# Patient Record
Sex: Female | Born: 2005 | Hispanic: Yes | Marital: Single | Smoking: Never smoker
Health system: Southern US, Community
[De-identification: ages and names within clinical notes are randomized; demographics above are authoritative.]

---
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
## 2023-04-05 ENCOUNTER — Other Ambulatory Visit: Payer: Self-pay

## 2023-04-05 ENCOUNTER — Encounter: Payer: Self-pay | Admitting: Emergency Medicine

## 2023-04-05 ENCOUNTER — Emergency Department
Admission: EM | Admit: 2023-04-05 | Discharge: 2023-04-05 | Disposition: A | Discharge status: Home / Self Care | Payer: MEDICAID | Attending: Emergency Medicine | Admitting: Emergency Medicine

## 2023-04-05 DIAGNOSIS — K2900 Acute gastritis without bleeding: Secondary | ICD-10-CM | POA: Diagnosis not present

## 2023-04-05 DIAGNOSIS — R112 Nausea with vomiting, unspecified: Secondary | ICD-10-CM | POA: Diagnosis present

## 2023-04-05 LAB — URINALYSIS, ROUTINE W REFLEX MICROSCOPIC
Bilirubin Urine: NEGATIVE
Glucose, UA: NEGATIVE mg/dL
Hgb urine dipstick: NEGATIVE
Ketones, ur: 20 mg/dL — AB
Leukocytes,Ua: NEGATIVE
Nitrite: NEGATIVE
Protein, ur: 30 mg/dL — AB
Specific Gravity, Urine: 1.026 (ref 1.005–1.030)
pH: 6 (ref 5.0–8.0)

## 2023-04-05 LAB — COMPREHENSIVE METABOLIC PANEL
ALT: 22 U/L (ref 0–44)
AST: 20 U/L (ref 15–41)
Albumin: 4.6 g/dL (ref 3.5–5.0)
Alkaline Phosphatase: 63 U/L (ref 38–126)
Anion gap: 14 (ref 5–15)
BUN: 25 mg/dL — ABNORMAL HIGH (ref 6–20)
CO2: 24 mmol/L (ref 22–32)
Calcium: 9.4 mg/dL (ref 8.9–10.3)
Chloride: 96 mmol/L — ABNORMAL LOW (ref 98–111)
Creatinine, Ser: 0.75 mg/dL (ref 0.44–1.00)
GFR, Estimated: >60 mL/min (ref >60)
Glucose, Bld: 116 mg/dL — ABNORMAL HIGH (ref 70–99)
Potassium: 3.2 mmol/L — ABNORMAL LOW (ref 3.5–5.1)
Sodium: 134 mmol/L — ABNORMAL LOW (ref 135–145)
Total Bilirubin: 1.2 mg/dL — ABNORMAL HIGH (ref <1.2)
Total Protein: 8 g/dL (ref 6.5–8.1)

## 2023-04-05 LAB — CBC
HCT: 41.2 % (ref 36.0–46.0)
Hemoglobin: 14.2 g/dL (ref 12.0–15.0)
MCH: 28.4 pg (ref 26.0–34.0)
MCHC: 34.5 g/dL (ref 30.0–36.0)
MCV: 82.4 fL (ref 80.0–100.0)
Platelets: 364 10*3/uL (ref 150–400)
RBC: 5 MIL/uL (ref 3.87–5.11)
RDW: 13.6 % (ref 11.5–15.5)
WBC: 10.6 10*3/uL — ABNORMAL HIGH (ref 4.0–10.5)
nRBC: 0 % (ref 0.0–0.2)

## 2023-04-05 LAB — LIPASE, BLOOD: Lipase: 23 U/L (ref 11–51)

## 2023-04-05 LAB — POC URINE PREG, ED: Preg Test, Ur: NEGATIVE

## 2023-04-05 MED ORDER — ONDANSETRON 4 MG PO TBDP: 4.0000 mg | ORAL_TABLET | Freq: Three times a day (TID) | ORAL | 0 refills | Status: DC | PRN

## 2023-04-05 MED ORDER — ONDANSETRON 4 MG PO TBDP
4.0000 mg | ORAL_TABLET | Freq: Once | ORAL | Status: AC
  Administered 2023-04-05: 4 mg via ORAL
  Filled 2023-04-05: qty 1

## 2023-04-05 NOTE — ED Provider Notes (Signed)
[facility] Provider Note    Event Date/Time   First MD Initiated Contact with Patient 04/05/23 [phone]     (approximate)   History   Emesis   HPI  Yanette Wiesner is a 17 y.o. female with no significant past medical history presents with complaints of nausea and vomiting which is been occurring for 2 days.  She denies fevers, no diarrhea reported.  Denies abdominal pain.  Reports normal periods.  Has not take anything for this     Physical Exam   Triage Vital Signs: ED Triage Vitals [04/05/23 0827]  Encounter Vitals Group     BP 122/81     Systolic BP Percentile      Diastolic BP Percentile      Pulse Rate 74     Resp 17     Temp 98.1 F (36.7 C)     Temp Source Oral     SpO2 97 %     Weight 77.1 kg (169 lb 15.6 oz)     Height 1.6 m (5\' 3" )     Head Circumference      Peak Flow      Pain Score 0     Pain Loc      Pain Education      Exclude from Growth Chart     Most recent vital signs: Vitals:   04/05/23 0827  BP: 122/81  Pulse: 74  Resp: 17  Temp: 98.1 F (36.7 C)  SpO2: 97%     General: Awake, no distress.  CV:  Good peripheral perfusion.  Resp:  Normal effort.  Abd:  No distention.  Soft, nontender reassuring exam Other:     ED Results / Procedures / Treatments   Labs (all labs ordered are listed, but only abnormal results are displayed) Labs Reviewed  COMPREHENSIVE METABOLIC PANEL - Abnormal; Notable for the following components:      Result Value   Sodium 134 (*)    Potassium 3.2 (*)    Chloride 96 (*)    Glucose, Bld 116 (*)    BUN 25 (*)    Total Bilirubin 1.2 (*)    All other components within normal limits  CBC - Abnormal; Notable for the following components:   WBC 10.6 (*)    All other components within normal limits  URINALYSIS, ROUTINE W REFLEX MICROSCOPIC - Abnormal; Notable for the following components:   Color, Urine YELLOW (*)    APPearance CLOUDY (*)    Ketones, ur 20 (*)     Protein, ur 30 (*)    Bacteria, UA RARE (*)    All other components within normal limits  LIPASE, BLOOD  POC URINE PREG, ED     EKG     RADIOLOGY     PROCEDURES:  Critical Care performed:   Procedures   MEDICATIONS ORDERED IN ED: Medications  ondansetron (ZOFRAN-ODT) disintegrating tablet 4 mg (4 mg Oral Given 04/05/23 0900)     IMPRESSION / MDM / ASSESSMENT AND PLAN / ED COURSE  I reviewed the triage vital signs and the nursing notes. Patient's presentation is most consistent with acute illness / injury with system symptoms.  Patient presents with nausea and vomiting as detailed above, differential includes foodborne illness, viral gastritis/gastroenteritis.  Overall quite well-appearing reassuring abdominal exam, lab work is unremarkable and unchanged from prior labs.  Pregnancy test is negative.  Will treat with ODT Zofran, supportive care, outpatient follow-up, suspect viral gastritis which is prevalent in  the community at this time.  Turn precautions discussed, patient agrees with this plan, no indication for admission at this time.        FINAL CLINICAL IMPRESSION(S) / ED DIAGNOSES   Final diagnoses:  Acute gastritis without hemorrhage, unspecified gastritis type     Rx / DC Orders   ED Discharge Orders          Ordered    ondansetron (ZOFRAN-ODT) 4 MG disintegrating tablet  Every 8 hours PRN        04/05/23 0911             Note:  This document was prepared using Dragon voice recognition software and may include unintentional dictation errors.   Jene Every, MD 04/05/23 [phone]

## 2023-04-05 NOTE — ED Triage Notes (Signed)
Pt here with vomiting since Sat. Pt denies abd pain. Pt denies fever. Pt ambulatory to triage.

## 2023-12-29 ENCOUNTER — Other Ambulatory Visit: Payer: Self-pay

## 2023-12-29 ENCOUNTER — Emergency Department
Admission: EM | Admit: 2023-12-29 | Discharge: 2023-12-29 | Disposition: A | Discharge status: Home / Self Care | Payer: MEDICAID | Attending: Emergency Medicine | Admitting: Emergency Medicine

## 2023-12-29 ENCOUNTER — Emergency Department: Payer: MEDICAID

## 2023-12-29 DIAGNOSIS — R112 Nausea with vomiting, unspecified: Secondary | ICD-10-CM | POA: Insufficient documentation

## 2023-12-29 DIAGNOSIS — D72829 Elevated white blood cell count, unspecified: Secondary | ICD-10-CM | POA: Diagnosis not present

## 2023-12-29 DIAGNOSIS — R1084 Generalized abdominal pain: Secondary | ICD-10-CM | POA: Diagnosis not present

## 2023-12-29 LAB — POC URINE PREG, ED: Preg Test, Ur: NEGATIVE

## 2023-12-29 LAB — CBC
HCT: 44 % (ref 36.0–46.0)
Hemoglobin: 15.2 g/dL — ABNORMAL HIGH (ref 12.0–15.0)
MCH: 28.1 pg (ref 26.0–34.0)
MCHC: 34.5 g/dL (ref 30.0–36.0)
MCV: 81.3 fL (ref 80.0–100.0)
Platelets: 506 K/uL — ABNORMAL HIGH (ref 150–400)
RBC: 5.41 MIL/uL — ABNORMAL HIGH (ref 3.87–5.11)
RDW: 13.8 % (ref 11.5–15.5)
WBC: 18.8 K/uL — ABNORMAL HIGH (ref 4.0–10.5)
nRBC: 0 % (ref 0.0–0.2)

## 2023-12-29 LAB — URINALYSIS, ROUTINE W REFLEX MICROSCOPIC
Bilirubin Urine: NEGATIVE
Glucose, UA: NEGATIVE mg/dL
Hgb urine dipstick: NEGATIVE
Ketones, ur: 20 mg/dL — AB
Leukocytes,Ua: NEGATIVE
Nitrite: NEGATIVE
Protein, ur: NEGATIVE mg/dL
Specific Gravity, Urine: >1.046 — ABNORMAL HIGH (ref 1.005–1.030)
pH: 6 (ref 5.0–8.0)

## 2023-12-29 LAB — COMPREHENSIVE METABOLIC PANEL WITH GFR
ALT: 16 U/L (ref 0–44)
AST: 17 U/L (ref 15–41)
Albumin: 4.8 g/dL (ref 3.5–5.0)
Alkaline Phosphatase: 66 U/L (ref 38–126)
Anion gap: 20 — ABNORMAL HIGH (ref 5–15)
BUN: 23 mg/dL — ABNORMAL HIGH (ref 6–20)
CO2: 23 mmol/L (ref 22–32)
Calcium: 9.9 mg/dL (ref 8.9–10.3)
Chloride: 94 mmol/L — ABNORMAL LOW (ref 98–111)
Creatinine, Ser: 0.7 mg/dL (ref 0.44–1.00)
GFR, Estimated: >60 mL/min (ref >60)
Glucose, Bld: 109 mg/dL — ABNORMAL HIGH (ref 70–99)
Potassium: 3.3 mmol/L — ABNORMAL LOW (ref 3.5–5.1)
Sodium: 137 mmol/L (ref 135–145)
Total Bilirubin: 1.9 mg/dL — ABNORMAL HIGH (ref 0.0–1.2)
Total Protein: 8.2 g/dL — ABNORMAL HIGH (ref 6.5–8.1)

## 2023-12-29 LAB — LIPASE, BLOOD: Lipase: 30 U/L (ref 11–51)

## 2023-12-29 MED ORDER — ONDANSETRON HCL 4 MG/2ML IJ SOLN
4.0000 mg | Freq: Once | INTRAMUSCULAR | Status: AC
  Administered 2023-12-29: 4 mg via INTRAVENOUS
  Filled 2023-12-29: qty 2

## 2023-12-29 MED ORDER — LACTATED RINGERS IV BOLUS
1000.0000 mL | Freq: Once | INTRAVENOUS | Status: AC
  Administered 2023-12-29: 1000 mL via INTRAVENOUS

## 2023-12-29 MED ORDER — PROCHLORPERAZINE EDISYLATE 10 MG/2ML IJ SOLN
10.0000 mg | Freq: Once | INTRAMUSCULAR | Status: AC
  Administered 2023-12-29: 10 mg via INTRAVENOUS
  Filled 2023-12-29: qty 2

## 2023-12-29 MED ORDER — PROCHLORPERAZINE EDISYLATE 10 MG/2ML IJ SOLN: 5.0000 mg | Freq: Once | INTRAMUSCULAR | Status: DC

## 2023-12-29 MED ORDER — POTASSIUM CHLORIDE CRYS ER 20 MEQ PO TBCR
40.0000 meq | EXTENDED_RELEASE_TABLET | Freq: Once | ORAL | Status: AC
  Administered 2023-12-29: 40 meq via ORAL
  Filled 2023-12-29: qty 2

## 2023-12-29 MED ORDER — IOHEXOL 300 MG/ML  SOLN
100.0000 mL | Freq: Once | INTRAMUSCULAR | Status: AC | PRN
  Administered 2023-12-29: 100 mL via INTRAVENOUS

## 2023-12-29 MED ORDER — ONDANSETRON 4 MG PO TBDP: 4.0000 mg | ORAL_TABLET | Freq: Three times a day (TID) | ORAL | 0 refills | Status: AC | PRN

## 2023-12-29 NOTE — ED Triage Notes (Signed)
 Patient states vomiting since Saturday; reports only has abdominal pain while vomiting. Denies diarrhea and urinary symptoms, no known sick contacts.

## 2023-12-29 NOTE — ED Provider Notes (Signed)
 [facility] Provider Note    Event Date/Time   First MD Initiated Contact with Patient 12/29/23 1603     (approximate)   History   Chief Complaint Emesis   HPI  Kelly Gonzales is a 18 y.o. female with no significant past medical history who presents to the ED complaining of nausea and vomiting.  Patient reports that she has been dealing with persistent nausea and numerous episodes of vomiting over the past 4 days.  She has had difficulty keeping anything down during this time but denies any diarrhea.  She has not had any fevers, flank pain, or dysuria.  She does endorse diffuse abdominal pain, primarily when she goes to vomit.  She has never had surgery on her abdomen.     Physical Exam   Triage Vital Signs: ED Triage Vitals [12/29/23 1558]  Encounter Vitals Group     BP 109/68     Girls Systolic BP Percentile      Girls Diastolic BP Percentile      Boys Systolic BP Percentile      Boys Diastolic BP Percentile      Pulse Rate (!) 112     Resp 20     Temp 98.2 F (36.8 C)     Temp Source Oral     SpO2 99 %     Weight 180 lb (81.6 kg)     Height 5' 3 (1.6 m)     Head Circumference      Peak Flow      Pain Score 0     Pain Loc      Pain Education      Exclude from Growth Chart     Most recent vital signs: Vitals:   12/29/23 1558  BP: 109/68  Pulse: (!) 112  Resp: 20  Temp: 98.2 F (36.8 C)  SpO2: 99%    Constitutional: Alert and oriented. Eyes: Conjunctivae are normal. Head: Atraumatic. Nose: No congestion/rhinnorhea. Mouth/Throat: Mucous membranes are dry. Cardiovascular: Normal rate, regular rhythm. Grossly normal heart sounds.  2+ radial pulses bilaterally. Respiratory: Normal respiratory effort.  No retractions. Lungs CTAB. Gastrointestinal: Soft and tender to palpation diffusely with no rebound or guarding. No distention. Musculoskeletal: No lower extremity tenderness nor edema.  Neurologic:  Normal speech and  language. No gross focal neurologic deficits are appreciated.    ED Results / Procedures / Treatments   Labs (all labs ordered are listed, but only abnormal results are displayed) Labs Reviewed  COMPREHENSIVE METABOLIC PANEL WITH GFR - Abnormal; Notable for the following components:      Result Value   Potassium 3.3 (*)    Chloride 94 (*)    Glucose, Bld 109 (*)    BUN 23 (*)    Total Protein 8.2 (*)    Total Bilirubin 1.9 (*)    Anion gap 20 (*)    All other components within normal limits  CBC - Abnormal; Notable for the following components:   WBC 18.8 (*)    RBC 5.41 (*)    Hemoglobin 15.2 (*)    Platelets 506 (*)    All other components within normal limits  URINALYSIS, ROUTINE W REFLEX MICROSCOPIC - Abnormal; Notable for the following components:   Color, Urine YELLOW (*)    APPearance CLEAR (*)    Specific Gravity, Urine >1.046 (*)    Ketones, ur 20 (*)    All other components within normal limits  LIPASE, BLOOD  POC URINE PREG,  ED    RADIOLOGY CT abdomen/pelvis reviewed and interpreted by me with no inflammatory changes, focal fluid collections, or dilated bowel loops.  PROCEDURES:  Critical Care performed: No  Procedures   MEDICATIONS ORDERED IN ED: Medications  ondansetron  (ZOFRAN ) injection 4 mg (4 mg Intravenous Given 12/29/23 1643)  lactated ringers  bolus 1,000 mL (0 mLs Intravenous Stopped 12/29/23 1753)  iohexol  (OMNIPAQUE ) 300 MG/ML solution 100 mL (100 mLs Intravenous Contrast Given 12/29/23 1704)  potassium chloride  SA (KLOR-CON  M) CR tablet 40 mEq (40 mEq Oral Given 12/29/23 1756)  lactated ringers  bolus 1,000 mL (1,000 mLs Intravenous New Bag/Given 12/29/23 1810)  prochlorperazine  (COMPAZINE ) injection 10 mg (10 mg Intravenous Given 12/29/23 1818)     IMPRESSION / MDM / ASSESSMENT AND PLAN / ED COURSE  I reviewed the triage vital signs and the nursing notes.                              18 y.o. female with no significant past medical history  who presents to the ED complaining of persistent nausea and vomiting over the past 4 days.  Patient's presentation is most consistent with acute presentation with potential threat to life or bodily function.  Differential diagnosis includes, but is not limited to, gastroenteritis, dehydration, electrolyte abnormality, AKI, UTI, kidney stone, appendicitis, diverticulitis, cholecystitis, biliary colic.  Patient nontoxic-appearing and in no acute distress, vital signs are remarkable for tachycardia but otherwise reassuring.  Her abdomen is soft but she does have diffuse tenderness to palpation on exam, will further assess with CT imaging.  Labs thus far show leukocytosis without significant anemia, will hydrate with IV fluids and treat symptomatically with IV Zofran .  CT imaging with under distention of the stomach but otherwise unremarkable.  Labs with signs of dehydration including elevated BUN to creatinine ratio and increased anion gap along with bilirubin.  No significant AKI or electrolyte abnormality, additional LFTs and lipase are unremarkable.  She did not have much improvement with Zofran , but now feeling much better following IV Compazine .  She is tolerating oral intake without difficulty and is appropriate for discharge home, was counseled to return to the ED for new or worsening symptoms.  Patient agrees with plan.      FINAL CLINICAL IMPRESSION(S) / ED DIAGNOSES   Final diagnoses:  Nausea and vomiting, unspecified vomiting type  Generalized abdominal pain     Rx / DC Orders   ED Discharge Orders          Ordered    ondansetron  (ZOFRAN -ODT) 4 MG disintegrating tablet  Every 8 hours PRN        12/29/23 1852             Note:  This document was prepared using Dragon voice recognition software and may include unintentional dictation errors.   Willo Dunnings, MD 12/29/23 [phone]

## 2024-02-26 ENCOUNTER — Other Ambulatory Visit: Payer: Self-pay

## 2024-02-26 DIAGNOSIS — Z5321 Procedure and treatment not carried out due to patient leaving prior to being seen by health care provider: Secondary | ICD-10-CM | POA: Diagnosis not present

## 2024-02-26 DIAGNOSIS — E860 Dehydration: Secondary | ICD-10-CM | POA: Diagnosis not present

## 2024-02-26 DIAGNOSIS — R112 Nausea with vomiting, unspecified: Secondary | ICD-10-CM | POA: Diagnosis present

## 2024-02-26 DIAGNOSIS — R1084 Generalized abdominal pain: Secondary | ICD-10-CM | POA: Diagnosis not present

## 2024-02-26 DIAGNOSIS — R109 Unspecified abdominal pain: Secondary | ICD-10-CM | POA: Diagnosis not present

## 2024-02-26 DIAGNOSIS — R1110 Vomiting, unspecified: Secondary | ICD-10-CM | POA: Diagnosis present

## 2024-02-26 LAB — CBC
HCT: 41.1 % (ref 36.0–46.0)
Hemoglobin: 14 g/dL (ref 12.0–15.0)
MCH: 28.3 pg (ref 26.0–34.0)
MCHC: 34.1 g/dL (ref 30.0–36.0)
MCV: 83 fL (ref 80.0–100.0)
Platelets: 448 K/uL — ABNORMAL HIGH (ref 150–400)
RBC: 4.95 MIL/uL (ref 3.87–5.11)
RDW: 14.1 % (ref 11.5–15.5)
WBC: 13.3 K/uL — ABNORMAL HIGH (ref 4.0–10.5)
nRBC: 0 % (ref 0.0–0.2)

## 2024-02-26 LAB — URINALYSIS, ROUTINE W REFLEX MICROSCOPIC
Bacteria, UA: NONE SEEN
Bilirubin Urine: NEGATIVE
Glucose, UA: NEGATIVE mg/dL
Ketones, ur: 20 mg/dL — AB
Leukocytes,Ua: NEGATIVE
Nitrite: NEGATIVE
Protein, ur: 30 mg/dL — AB
Specific Gravity, Urine: 1.024 (ref 1.005–1.030)
pH: 6 (ref 5.0–8.0)

## 2024-02-26 LAB — POC URINE PREG, ED: Preg Test, Ur: NEGATIVE

## 2024-02-26 NOTE — ED Triage Notes (Addendum)
 Patient arrived POV from home with complaint of nausea and vomiting x 3 days.    Denies fever, denies abd pain.   Patient and friend reports symptoms started after smoking marijuana.

## 2024-02-27 ENCOUNTER — Emergency Department
Admission: EM | Admit: 2024-02-27 | Discharge: 2024-02-27 | Discharge status: Against Medical Advice | Payer: MEDICAID | Attending: Emergency Medicine | Admitting: Emergency Medicine

## 2024-02-27 ENCOUNTER — Emergency Department
Admission: EM | Admit: 2024-02-27 | Discharge: 2024-02-27 | Disposition: A | Discharge status: Home / Self Care | Payer: MEDICAID | Source: Home / Self Care | Attending: Emergency Medicine | Admitting: Emergency Medicine

## 2024-02-27 ENCOUNTER — Other Ambulatory Visit: Payer: Self-pay

## 2024-02-27 DIAGNOSIS — R112 Nausea with vomiting, unspecified: Secondary | ICD-10-CM | POA: Insufficient documentation

## 2024-02-27 DIAGNOSIS — R109 Unspecified abdominal pain: Secondary | ICD-10-CM | POA: Insufficient documentation

## 2024-02-27 DIAGNOSIS — R1110 Vomiting, unspecified: Secondary | ICD-10-CM | POA: Insufficient documentation

## 2024-02-27 DIAGNOSIS — Z5321 Procedure and treatment not carried out due to patient leaving prior to being seen by health care provider: Secondary | ICD-10-CM | POA: Insufficient documentation

## 2024-02-27 DIAGNOSIS — E860 Dehydration: Secondary | ICD-10-CM | POA: Insufficient documentation

## 2024-02-27 DIAGNOSIS — R1084 Generalized abdominal pain: Secondary | ICD-10-CM | POA: Insufficient documentation

## 2024-02-27 LAB — COMPREHENSIVE METABOLIC PANEL WITH GFR
ALT: 11 U/L (ref 0–44)
ALT: 12 U/L (ref 0–44)
AST: 15 U/L (ref 15–41)
AST: 13 U/L — ABNORMAL LOW (ref 15–41)
Albumin: 5 g/dL (ref 3.5–5.0)
Albumin: 4.6 g/dL (ref 3.5–5.0)
Alkaline Phosphatase: 68 U/L (ref 38–126)
Alkaline Phosphatase: 66 U/L (ref 38–126)
Anion gap: 14 (ref 5–15)
Anion gap: 16 — ABNORMAL HIGH (ref 5–15)
BUN: 19 mg/dL (ref 6–20)
BUN: 22 mg/dL — ABNORMAL HIGH (ref 6–20)
CO2: 25 mmol/L (ref 22–32)
CO2: 24 mmol/L (ref 22–32)
Calcium: 9.8 mg/dL (ref 8.9–10.3)
Calcium: 9.7 mg/dL (ref 8.9–10.3)
Chloride: 101 mmol/L (ref 98–111)
Chloride: 97 mmol/L — ABNORMAL LOW (ref 98–111)
Creatinine, Ser: 0.55 mg/dL (ref 0.44–1.00)
Creatinine, Ser: 0.59 mg/dL (ref 0.44–1.00)
GFR, Estimated: >60 mL/min (ref >60)
GFR, Estimated: >60 mL/min
Glucose, Bld: 130 mg/dL — ABNORMAL HIGH (ref 70–99)
Glucose, Bld: 113 mg/dL — ABNORMAL HIGH (ref 70–99)
Potassium: 3.2 mmol/L — ABNORMAL LOW (ref 3.5–5.1)
Potassium: 3.1 mmol/L — ABNORMAL LOW (ref 3.5–5.1)
Sodium: 140 mmol/L (ref 135–145)
Sodium: 137 mmol/L (ref 135–145)
Total Bilirubin: 1.1 mg/dL (ref 0.0–1.2)
Total Bilirubin: 1.2 mg/dL (ref 0.0–1.2)
Total Protein: 9.3 g/dL — ABNORMAL HIGH (ref 6.5–8.1)
Total Protein: 8.9 g/dL — ABNORMAL HIGH (ref 6.5–8.1)

## 2024-02-27 LAB — RESP PANEL BY RT-PCR (RSV, FLU A&B, COVID)  RVPGX2
Influenza A by PCR: NEGATIVE
Influenza B by PCR: NEGATIVE
Resp Syncytial Virus by PCR: NEGATIVE
SARS Coronavirus 2 by RT PCR: NEGATIVE

## 2024-02-27 LAB — CBC
HCT: 40.1 % (ref 36.0–46.0)
HCT: 40.1 % (ref 36.0–46.0)
Hemoglobin: 13.4 g/dL (ref 12.0–15.0)
Hemoglobin: 13.5 g/dL (ref 12.0–15.0)
MCH: 27.6 pg (ref 26.0–34.0)
MCH: 28 pg (ref 26.0–34.0)
MCHC: 33.4 g/dL (ref 30.0–36.0)
MCHC: 33.7 g/dL (ref 30.0–36.0)
MCV: 82.5 fL (ref 80.0–100.0)
MCV: 83 fL (ref 80.0–100.0)
Platelets: 455 10*3/uL — ABNORMAL HIGH (ref 150–400)
Platelets: 447 K/uL — ABNORMAL HIGH (ref 150–400)
RBC: 4.86 MIL/uL (ref 3.87–5.11)
RBC: 4.83 MIL/uL (ref 3.87–5.11)
RDW: 14.1 % (ref 11.5–15.5)
RDW: 13.9 % (ref 11.5–15.5)
WBC: 12.7 10*3/uL — ABNORMAL HIGH (ref 4.0–10.5)
WBC: 10.5 K/uL (ref 4.0–10.5)
nRBC: 0 % (ref 0.0–0.2)
nRBC: 0 % (ref 0.0–0.2)

## 2024-02-27 LAB — URINALYSIS, ROUTINE W REFLEX MICROSCOPIC
Bacteria, UA: NONE SEEN
Bilirubin Urine: NEGATIVE
Glucose, UA: NEGATIVE mg/dL
Ketones, ur: 20 mg/dL — AB
Leukocytes,Ua: NEGATIVE
Nitrite: NEGATIVE
Protein, ur: 30 mg/dL — AB
RBC / HPF: >50 RBC/hpf (ref 0–5)
Specific Gravity, Urine: 1.031 — ABNORMAL HIGH (ref 1.005–1.030)
pH: 6 (ref 5.0–8.0)

## 2024-02-27 LAB — POC URINE PREG, ED: Preg Test, Ur: NEGATIVE

## 2024-02-27 LAB — LIPASE, BLOOD: Lipase: 22 U/L (ref 11–51)

## 2024-02-27 MED ORDER — METOCLOPRAMIDE HCL 5 MG/ML IJ SOLN
10.0000 mg | Freq: Once | INTRAMUSCULAR | Status: AC
  Administered 2024-02-27: 10 mg via INTRAVENOUS
  Filled 2024-02-27: qty 2

## 2024-02-27 MED ORDER — METOCLOPRAMIDE HCL 10 MG PO TABS: 10.0000 mg | ORAL_TABLET | Freq: Three times a day (TID) | ORAL | 0 refills | Status: AC | PRN

## 2024-02-27 MED ORDER — SODIUM CHLORIDE 0.9 % IV BOLUS: 1000.0000 mL | Freq: Once | INTRAVENOUS | Status: DC

## 2024-02-27 MED ORDER — LACTATED RINGERS IV BOLUS
1000.0000 mL | Freq: Once | INTRAVENOUS | Status: AC
  Administered 2024-02-27: 1000 mL via INTRAVENOUS

## 2024-02-27 NOTE — ED Provider Notes (Signed)
 [facility] Provider Note    Event Date/Time   First MD Initiated Contact with Patient 02/27/24 0703     (approximate)  History   Chief Complaint: Emesis  HPI  Kelly Gonzales is a 18 y.o. female presents to the emergency department for nausea and vomiting.  According to the patient over the last 3 days she has had fairly persistent nausea and vomiting has not been able to keep down much fluids or any food.  Patient states symptoms started after using marijuana products.  Patient has had similar events in the past after using marijuana products.  Patient states abdominal cramping but denies any focal pain.  No fever.  No diarrhea.  No cough congestion or respiratory symptoms.  No urinary symptoms.  Last menstrual period 1 week ago or so.  Physical Exam   Triage Vital Signs: ED Triage Vitals  Encounter Vitals Group     BP 02/27/24 0631 109/76     Girls Systolic BP Percentile --      Girls Diastolic BP Percentile --      Boys Systolic BP Percentile --      Boys Diastolic BP Percentile --      Pulse Rate 02/27/24 0631 96     Resp 02/27/24 0631 18     Temp 02/27/24 0631 98.2 F (36.8 C)     Temp Source 02/27/24 0631 Oral     SpO2 02/27/24 0631 99 %     Weight 02/27/24 0629 179 lb 14.3 oz (81.6 kg)     Height 02/27/24 0629 5' 3 (1.6 m)     Head Circumference --      Peak Flow --      Pain Score 02/27/24 0629 0     Pain Loc --      Pain Education --      Exclude from Growth Chart --     Most recent vital signs: Vitals:   02/27/24 0631 02/27/24 0719  BP: 109/76   Pulse: 96   Resp: 18   Temp: 98.2 F (36.8 C)   SpO2: 99% 99%    General: Awake, no distress.  CV:  Good peripheral perfusion.  Regular rate and rhythm  Resp:  Normal effort.  Equal breath sounds bilaterally.  Abd:  No distention.  Soft, mild diffuse tenderness with no focal tenderness identified no significant tenderness no rebound or guarding.   ED Results /  Procedures / Treatments   MEDICATIONS ORDERED IN ED: Medications  lactated ringers  bolus 1,000 mL (1,000 mLs Intravenous New Bag/Given 02/27/24 0719)  metoCLOPramide (REGLAN) injection 10 mg (10 mg Intravenous Given 02/27/24 0721)     IMPRESSION / MDM / ASSESSMENT AND PLAN / ED COURSE  I reviewed the triage vital signs and the nursing notes.  Patient's presentation is most consistent with acute presentation with potential threat to life or bodily function.  Patient presents emergency department for nausea and vomiting started after using marijuana products per patient.  Symptoms have been ongoing for approximately 2 to 3 days.  Overall patient appears well, no distress speaking in full sentences.  Reassuring vital signs, reassuring physical exam.  We will IV hydrate, treat with IV Reglan and continue to closely monitor.  Chemistry shows dehydration with a mild anion gap elevation of 16.  Will IV hydrate with 2 L.  Patient states she is ready to go and that the nausea is better.  States she does not want any more fluids.  Patient CBC  has not yet resulted, patient states she does not want to wait any longer is ready go home.  Patient only received approximately 800 cc of IV fluids.  We will discharge per her wishes.  She continues to appear well.  We will prescribe Reglan for nausea.  FINAL CLINICAL IMPRESSION(S) / ED DIAGNOSES   Nausea and vomiting   Note:  This document was prepared using Dragon voice recognition software and may include unintentional dictation errors.   Dorothyann Drivers, MD 02/27/24 [phone]

## 2024-02-27 NOTE — ED Triage Notes (Signed)
 Patient arrived POV from home with complaint of nausea and vomiting x 3 days.    Denies fever, denies abd pain.   Patient and friend reports symptoms started after smoking marijuana.

## 2024-02-27 NOTE — ED Triage Notes (Signed)
 Patient ambulatory to triage with complaints of emesis x4 days. Patient states she has had this previously and it was cannabinoid hyperemesis. Still smokes marijuana regularly. Endorses mild abdominal pain mostly when vomiting.

## 2024-02-28 ENCOUNTER — Emergency Department
Admission: EM | Admit: 2024-02-28 | Discharge: 2024-02-28 | Discharge status: Against Medical Advice | Payer: MEDICAID | Attending: Emergency Medicine | Admitting: Emergency Medicine

## 2024-02-28 LAB — COMPREHENSIVE METABOLIC PANEL WITH GFR
ALT: 12 U/L (ref 0–44)
AST: 12 U/L — ABNORMAL LOW (ref 15–41)
Albumin: 4.5 g/dL (ref 3.5–5.0)
Alkaline Phosphatase: 62 U/L (ref 38–126)
Anion gap: 12 (ref 5–15)
BUN: 20 mg/dL (ref 6–20)
CO2: 24 mmol/L (ref 22–32)
Calcium: 9.5 mg/dL (ref 8.9–10.3)
Chloride: 100 mmol/L (ref 98–111)
Creatinine, Ser: 0.59 mg/dL (ref 0.44–1.00)
GFR, Estimated: >60 mL/min (ref >60)
Glucose, Bld: 116 mg/dL — ABNORMAL HIGH (ref 70–99)
Potassium: 3.2 mmol/L — ABNORMAL LOW (ref 3.5–5.1)
Sodium: 136 mmol/L (ref 135–145)
Total Bilirubin: 1.4 mg/dL — ABNORMAL HIGH (ref 0.0–1.2)
Total Protein: 8.7 g/dL — ABNORMAL HIGH (ref 6.5–8.1)

## 2024-02-28 LAB — LIPASE, BLOOD: Lipase: 24 U/L (ref 11–51)

## 2024-02-28 NOTE — ED Notes (Signed)
 Pt not visualized in lobby, or out front of department, no answer when called x3.

## 2024-02-28 NOTE — ED Notes (Signed)
 No answer x2

## 2024-02-28 NOTE — ED Notes (Signed)
 No answer x1

## 2024-03-26 ENCOUNTER — Emergency Department
Admission: EM | Admit: 2024-03-26 | Discharge: 2024-03-26 | Disposition: A | Discharge status: Home / Self Care | Payer: MEDICAID | Attending: Emergency Medicine | Admitting: Emergency Medicine

## 2024-03-26 ENCOUNTER — Other Ambulatory Visit: Payer: Self-pay

## 2024-03-26 DIAGNOSIS — R1013 Epigastric pain: Secondary | ICD-10-CM | POA: Diagnosis not present

## 2024-03-26 DIAGNOSIS — R112 Nausea with vomiting, unspecified: Secondary | ICD-10-CM | POA: Diagnosis present

## 2024-03-26 LAB — COMPREHENSIVE METABOLIC PANEL WITH GFR
ALT: 11 U/L (ref 0–44)
AST: 12 U/L — ABNORMAL LOW (ref 15–41)
Albumin: 5 g/dL (ref 3.5–5.0)
Alkaline Phosphatase: 80 U/L (ref 38–126)
Anion gap: 16 — ABNORMAL HIGH (ref 5–15)
BUN: 21 mg/dL — ABNORMAL HIGH (ref 6–20)
CO2: 22 mmol/L (ref 22–32)
Calcium: 9.6 mg/dL (ref 8.9–10.3)
Chloride: 97 mmol/L — ABNORMAL LOW (ref 98–111)
Creatinine, Ser: 0.56 mg/dL (ref 0.44–1.00)
GFR, Estimated: >60 mL/min (ref >60)
Glucose, Bld: 118 mg/dL — ABNORMAL HIGH (ref 70–99)
Potassium: 3.4 mmol/L — ABNORMAL LOW (ref 3.5–5.1)
Sodium: 134 mmol/L — ABNORMAL LOW (ref 135–145)
Total Bilirubin: 0.8 mg/dL (ref 0.0–1.2)
Total Protein: 8.2 g/dL — ABNORMAL HIGH (ref 6.5–8.1)

## 2024-03-26 LAB — LIPASE, BLOOD: Lipase: 14 U/L (ref 11–51)

## 2024-03-26 LAB — URINALYSIS, ROUTINE W REFLEX MICROSCOPIC
Bacteria, UA: NONE SEEN
Bilirubin Urine: NEGATIVE
Glucose, UA: NEGATIVE mg/dL
Hgb urine dipstick: NEGATIVE
Ketones, ur: 80 mg/dL — AB
Leukocytes,Ua: NEGATIVE
Nitrite: NEGATIVE
Protein, ur: 30 mg/dL — AB
Specific Gravity, Urine: 1.034 — ABNORMAL HIGH (ref 1.005–1.030)
pH: 5 (ref 5.0–8.0)

## 2024-03-26 LAB — CBC
HCT: 41.3 % (ref 36.0–46.0)
Hemoglobin: 14.2 g/dL (ref 12.0–15.0)
MCH: 28 pg (ref 26.0–34.0)
MCHC: 34.4 g/dL (ref 30.0–36.0)
MCV: 81.3 fL (ref 80.0–100.0)
Platelets: 436 K/uL — ABNORMAL HIGH (ref 150–400)
RBC: 5.08 MIL/uL (ref 3.87–5.11)
RDW: 14 % (ref 11.5–15.5)
WBC: 13.7 K/uL — ABNORMAL HIGH (ref 4.0–10.5)
nRBC: 0 % (ref 0.0–0.2)

## 2024-03-26 LAB — POC URINE PREG, ED: Preg Test, Ur: NEGATIVE

## 2024-03-26 MED ORDER — LACTATED RINGERS IV BOLUS
1000.0000 mL | Freq: Once | INTRAVENOUS | Status: AC
  Administered 2024-03-26: 1000 mL via INTRAVENOUS

## 2024-03-26 MED ORDER — METOCLOPRAMIDE HCL 5 MG/ML IJ SOLN
10.0000 mg | Freq: Once | INTRAMUSCULAR | Status: AC
  Administered 2024-03-26: 10 mg via INTRAVENOUS
  Filled 2024-03-26: qty 2

## 2024-03-26 MED ORDER — METOCLOPRAMIDE HCL 10 MG PO TABS: 10.0000 mg | ORAL_TABLET | Freq: Three times a day (TID) | ORAL | 0 refills | Status: AC | PRN

## 2024-03-26 NOTE — ED Triage Notes (Signed)
 Pt to ED for N/V since 3 days. Has not vomited today but is nauseous. States was vomiting a lot last night. Pt unsure of LNMP but says it was sometime last month. Denies pain at this time. Denies cough, fever, body aches and diarrhea.

## 2024-03-26 NOTE — Discharge Instructions (Signed)
 Please take your nausea medication as needed, as written.  Please drink plenty of fluids.  Return to the emergency department if you are unable to tolerate fluids or for any other symptom personally concerning to yourself.

## 2024-03-26 NOTE — ED Provider Notes (Signed)
 [facility] Provider Note    Event Date/Time   First MD Initiated Contact with Patient 03/26/24 1124     (approximate)  History   Chief Complaint: Emesis  HPI  Kelly Gonzales is a 18 y.o. female with no significant past medical history presents to the emergency department for nausea vomiting.  According to the patient for the past 3 days she has been nauseated with frequent episodes of vomiting.  Patient states a history of this and has been seen multiple times in the emergency department for the same in the past possibly cannabinoid induced but it is not clear.  Patient denies any abdominal pain.  Denies any fever.  No known sick contacts.  Physical Exam   Triage Vital Signs: ED Triage Vitals  Encounter Vitals Group     BP 03/26/24 1007 118/76     Girls Systolic BP Percentile --      Girls Diastolic BP Percentile --      Boys Systolic BP Percentile --      Boys Diastolic BP Percentile --      Pulse Rate 03/26/24 1007 91     Resp 03/26/24 1007 20     Temp 03/26/24 1010 97.9 F (36.6 C)     Temp Source 03/26/24 1010 Oral     SpO2 03/26/24 1007 98 %     Weight 03/26/24 1008 180 lb (81.6 kg)     Height 03/26/24 1008 5' 3 (1.6 m)     Head Circumference --      Peak Flow --      Pain Score 03/26/24 1007 0     Pain Loc --      Pain Education --      Exclude from Growth Chart --     Most recent vital signs: Vitals:   03/26/24 1007 03/26/24 1010  BP: 118/76   Pulse: 91   Resp: 20   Temp:  97.9 F (36.6 C)  SpO2: 98%     General: Awake, no distress.  CV:  Good peripheral perfusion.  Regular rate and rhythm  Resp:  Normal effort.  Equal breath sounds bilaterally.  Abd:  No distention.  Soft, minimal epigastric tenderness.  No rebound or guarding.  ED Results / Procedures / Treatments   MEDICATIONS ORDERED IN ED: Medications  lactated ringers  bolus 1,000 mL (1,000 mLs Intravenous New Bag/Given 03/26/24 1156)  metoCLOPramide   (REGLAN ) injection 10 mg (10 mg Intravenous Given 03/26/24 1153)     IMPRESSION / MDM / ASSESSMENT AND PLAN / ED COURSE  I reviewed the triage vital signs and the nursing notes.  Patient's presentation is most consistent with acute presentation with potential threat to life or bodily function.  Patient presents emergency department for nausea and vomiting.  Patient has a history of the same.  States she has not been able to keep down fluids for the last 2 to 3 days.  Here the patient appears well, no significant abdominal tenderness very minimal epigastric discomfort on palpation which she relates to the retching/vomiting.  Patient's lab work shows a CBC with slight leukocytosis of 13,000, chemistry shows mild dehydration with an anion gap of 16 reassuringly normal renal function.  LFTs and lipase are normal.  We will treat with IV fluids nausea medication and reassess.  We will send a urinalysis and urine pregnancy test.  Patient agreeable to plan.  Patient's urinalysis shows no concerning findings besides ketones.  Pregnancy test is negative.  Patient states  she is feeling better and is ready to go home.  We will discharge with Reglan  and have the patient follow-up with her doctor.  Discussed oral hydration and return precautions.  FINAL CLINICAL IMPRESSION(S) / ED DIAGNOSES   Nausea vomiting  Note:  This document was prepared using Dragon voice recognition software and may include unintentional dictation errors.   Dorothyann Drivers, MD 03/26/24 1309
